# Patient Record
Sex: Male | Born: 1950 | Race: Black or African American | Hispanic: No | Marital: Married | Smoking: Never smoker
Health system: Southern US, Community
[De-identification: ages and names within clinical notes are randomized; demographics above are authoritative.]

## PROBLEM LIST (undated history)

## (undated) DIAGNOSIS — E7800 Pure hypercholesterolemia, unspecified: Secondary | ICD-10-CM

## (undated) DIAGNOSIS — E119 Type 2 diabetes mellitus without complications: Secondary | ICD-10-CM

## (undated) DIAGNOSIS — I10 Essential (primary) hypertension: Secondary | ICD-10-CM

## (undated) HISTORY — PX: BACK SURGERY: SHX140

---

## 1998-04-22 ENCOUNTER — Other Ambulatory Visit: Admission: RE | Admit: 1998-04-22 | Discharge: 1998-04-22 | Payer: Self-pay | Admitting: Podiatry

## 2000-07-15 ENCOUNTER — Emergency Department (HOSPITAL_COMMUNITY): Admission: EM | Admit: 2000-07-15 | Discharge: 2000-07-15 | Payer: Self-pay | Admitting: Emergency Medicine

## 2003-04-12 ENCOUNTER — Observation Stay (HOSPITAL_COMMUNITY): Admission: EM | Admit: 2003-04-12 | Discharge: 2003-04-13 | Payer: Self-pay | Admitting: Emergency Medicine

## 2009-09-07 ENCOUNTER — Encounter: Admission: RE | Admit: 2009-09-07 | Discharge: 2009-09-07 | Payer: Self-pay | Admitting: Internal Medicine

## 2009-09-15 ENCOUNTER — Ambulatory Visit (HOSPITAL_COMMUNITY): Admission: RE | Admit: 2009-09-15 | Discharge: 2009-09-16 | Payer: Self-pay | Admitting: Neurological Surgery

## 2009-10-25 ENCOUNTER — Encounter: Admission: RE | Admit: 2009-10-25 | Discharge: 2009-10-25 | Payer: Self-pay | Admitting: Neurological Surgery

## 2009-12-27 ENCOUNTER — Encounter: Admission: RE | Admit: 2009-12-27 | Discharge: 2009-12-27 | Payer: Self-pay | Admitting: Neurological Surgery

## 2010-05-22 LAB — BASIC METABOLIC PANEL
Calcium: 9.3 mg/dL (ref 8.4–10.5)
Creatinine, Ser: 0.94 mg/dL (ref 0.4–1.5)
GFR calc Af Amer: >60 mL/min (ref >60)
GFR calc non Af Amer: >60 mL/min (ref >60)
Sodium: 137 mEq/L (ref 135–145)

## 2010-05-22 LAB — CBC
Hemoglobin: 14 g/dL (ref 13.0–17.0)
Platelets: 177 10*3/uL (ref 150–400)
RBC: 4.71 MIL/uL (ref 4.22–5.81)
WBC: 4.6 10*3/uL (ref 4.0–10.5)

## 2010-05-22 LAB — DIFFERENTIAL
Eosinophils Absolute: 0.1 10*3/uL (ref 0.0–0.7)
Lymphocytes Relative: 37 % (ref 12–46)
Lymphs Abs: 1.7 10*3/uL (ref 0.7–4.0)
Neutro Abs: 2.3 10*3/uL (ref 1.7–7.7)
Neutrophils Relative %: 50 % (ref 43–77)

## 2010-05-22 LAB — GLUCOSE, CAPILLARY

## 2010-05-22 LAB — APTT: aPTT: 29 seconds (ref 24–37)

## 2010-05-22 LAB — PROTIME-INR
INR: 0.99 (ref 0.00–1.49)
Prothrombin Time: 13 seconds (ref 11.6–15.2)

## 2010-05-22 LAB — SURGICAL PCR SCREEN
MRSA, PCR: NEGATIVE
Staphylococcus aureus: NEGATIVE

## 2010-07-22 NOTE — Discharge Summary (Signed)
NAMEARON, INGE NO.:  [id number]   MEDICAL RECORD NO.:  [id number]                   PATIENT TYPE:  INP   LOCATION:  0376                                 FACILITY:  [facility]   PHYSICIAN:  Candyce Churn, M.D.          DATE OF BIRTH:  Jul 09, 1950   DATE OF ADMISSION:  04/12/2003  DATE OF DISCHARGE:  04/13/2003                                 DISCHARGE SUMMARY   DISCHARGE DIAGNOSES:  1. Chest pain, atypical.  2. History of hypertension.  3. History of hyperlipidemia.   MEDICATIONS:  1. Lisinopril 10 mg daily.  2. Lipitor 10 mg daily.  3. Aspirin 81 mg daily.  4. Protonix 40 mg daily.  5. New medication:  Sublingual nitroglycerin 0.4 mg one sublingual every     five minutes p.r.n. severe chest pain.   HOSPITAL COURSE:  Mr. Steven Kelly is a very pleasant 60 year old male  with a history of hypertension and hyperlipidemia who presented to the  emergency room on April 12, 2003, complaining of onset of left precordial  chest pain that seemed to radiate down his left arm and his arm muscle felt  somewhat weak.  He noticed some diaphoresis, but no shortness of breath.  He  had been sitting in church for an hour or so and had not eaten breakfast and  developed pain.  Apparently, started to develop pain around noon, and by  2:00 it was still somewhat problematic.  He was still having the pain and  came to the [facility] Emergency Room.  He was given four aspirin in the  [facility] Emergency Room, no nitroglycerin with eventual resolution of his  chest and arm pain.   EKG in the emergency room revealed a sinus rhythm at 60 with possible  minimal voltage criteria for LVH.  He had nonspecific ST-T wave changes.  No  ST elevation or depression.  He had serial cardiac enzymes drawn revealing  CPK's to be in the 200 range with CK-MB 4.7 and 4.3, respectively, and  troponin-I was 0.01 and less than 0.01.  He had no further chest pains or  diaphoresis while hospitalized.   The patient had been taking some Mobic for chest wall pain the week before.   He had exercised at the [facility] four to five days prior to admission, and had  exercised for 30 to 45 minutes aerobically and had not had any chest pain at  all.  He did use a machine that really exercised his upper chest.   On exam on the day of discharge, he has palpable chest wall pain over the  precordial area.  He has excellent strength in the left upper extremity with  no evidence of radiculopathy.   It was felt the patient probably was having some atypical chest pain,  perhaps musculoskeletal.  He will be seen by Dr. Verdis Prime likely later in  the week for his treadmill stress testing.  We will send him out on the  medications as above, and he is to contact us immediately if he develops any  shortness of breath or severe recurrent pain.  He will also be given  sublingual nitroglycerin to use if he develops pain that is not relieved at  rest.                                               Candyce Churn, M.D.    RNG/MEDQ  D:  04/13/2003  T:  04/13/2003  Job:  [id number]   cc:   Lesleigh Noe, M.D.  [address]  [address]  [place]  [place] [address]  Fax: [phone]

## 2013-11-14 ENCOUNTER — Encounter (HOSPITAL_BASED_OUTPATIENT_CLINIC_OR_DEPARTMENT_OTHER): Payer: Self-pay | Admitting: Emergency Medicine

## 2013-11-14 ENCOUNTER — Emergency Department (HOSPITAL_BASED_OUTPATIENT_CLINIC_OR_DEPARTMENT_OTHER)
Admission: EM | Admit: 2013-11-14 | Discharge: 2013-11-14 | Disposition: A | Discharge status: Home / Self Care | Payer: 59 | Attending: Emergency Medicine | Admitting: Emergency Medicine

## 2013-11-14 DIAGNOSIS — E7800 Pure hypercholesterolemia, unspecified: Secondary | ICD-10-CM | POA: Insufficient documentation

## 2013-11-14 DIAGNOSIS — Z79899 Other long term (current) drug therapy: Secondary | ICD-10-CM | POA: Insufficient documentation

## 2013-11-14 DIAGNOSIS — E119 Type 2 diabetes mellitus without complications: Secondary | ICD-10-CM | POA: Diagnosis not present

## 2013-11-14 DIAGNOSIS — R739 Hyperglycemia, unspecified: Secondary | ICD-10-CM

## 2013-11-14 DIAGNOSIS — I10 Essential (primary) hypertension: Secondary | ICD-10-CM | POA: Diagnosis not present

## 2013-11-14 HISTORY — DX: Type 2 diabetes mellitus without complications: E11.9

## 2013-11-14 HISTORY — DX: Pure hypercholesterolemia, unspecified: E78.00

## 2013-11-14 HISTORY — DX: Essential (primary) hypertension: I10

## 2013-11-14 LAB — BASIC METABOLIC PANEL
ANION GAP: 14 (ref 5–15)
BUN: 19 mg/dL (ref 6–23)
CALCIUM: 9.6 mg/dL (ref 8.4–10.5)
CHLORIDE: 99 meq/L (ref 96–112)
CO2: 24 meq/L (ref 19–32)
CREATININE: 1.1 mg/dL (ref 0.50–1.35)
GFR calc Af Amer: 80 mL/min — ABNORMAL LOW (ref >90)
GFR calc non Af Amer: 69 mL/min — ABNORMAL LOW (ref >90)
GLUCOSE: 299 mg/dL — AB (ref 70–99)
Potassium: 3.9 mEq/L (ref 3.7–5.3)
SODIUM: 137 meq/L (ref 137–147)

## 2013-11-14 LAB — CBG MONITORING, ED: Glucose-Capillary: 281 mg/dL — ABNORMAL HIGH (ref 70–99)

## 2013-11-14 NOTE — Discharge Instructions (Signed)

## 2013-11-14 NOTE — ED Provider Notes (Signed)
CSN: [id number]     Arrival date & time 11/14/13  2058 History   First MD Initiated Contact with Patient 11/14/13 2118     Chief Complaint  Patient presents with  . Hyperglycemia     (Consider location/radiation/quality/duration/timing/severity/associated sxs/prior Treatment) Patient is a 63 y.o. male presenting with hyperglycemia. The history is provided by the patient and the spouse.  Hyperglycemia Blood sugar level PTA:  320 Severity:  Moderate Onset quality:  Gradual Duration:  1 week Timing:  Constant Progression:  Worsening Chronicity:  New Diabetes status:  Controlled with oral medications Current diabetic therapy:  Metformin Context: recent change in diet   Context: not change in medication   Relieved by:  Nothing Associated symptoms: no abdominal pain and no dysuria   Risk factors: family hx of diabetes     Past Medical History  Diagnosis Date  . Diabetes mellitus without complication   . Hypertension   . Hypercholesteremia    Past Surgical History  Procedure Laterality Date  . Back surgery     History reviewed. No pertinent family history. History  Substance Use Topics  . Smoking status: Never Smoker   . Smokeless tobacco: Not on file  . Alcohol Use: No    Review of Systems  Gastrointestinal: Negative for abdominal pain.  Genitourinary: Negative for dysuria.  All other systems reviewed and are negative.     Allergies  Review of patient's allergies indicates not on file.  Home Medications   Prior to Admission medications   Medication Sig Start Date End Date Taking? Authorizing Provider  lisinopril-hydrochlorothiazide (PRINZIDE,ZESTORETIC) 20-12.5 MG per tablet Take 1 tablet by mouth daily.   Yes Historical Provider, MD  metFORMIN (GLUCOPHAGE) 1000 MG tablet Take 1,000 mg by mouth 2 (two) times daily with a meal.   Yes Historical Provider, MD  rosuvastatin (CRESTOR) 10 MG tablet Take 10 mg by mouth daily.   Yes Historical Provider, MD   BP  142/86  Pulse 75  Temp(Src) 98.7 F (37.1 C)  Resp 18  Ht 6' (1.829 m)  Wt 213 lb (96.616 kg)  BMI 28.88 kg/m2  SpO2 100% Physical Exam  Nursing note and vitals reviewed. Constitutional: He is oriented to person, place, and time. He appears well-developed and well-nourished.  HENT:  Head: Normocephalic and atraumatic.  Eyes: EOM are normal. Pupils are equal, round, and reactive to light.  Neck: Normal range of motion.  Cardiovascular: Normal rate and normal heart sounds.   Pulmonary/Chest: Effort normal.  Abdominal: Soft. He exhibits no distension.  Musculoskeletal: Normal range of motion.  Neurological: He is alert and oriented to person, place, and time.  Skin: Skin is warm.  Psychiatric: He has a normal mood and affect.    ED Course  Procedures (including critical care time) Labs Review Labs Reviewed  CBG MONITORING, ED - Abnormal; Notable for the following:    Glucose-Capillary 281 (*)    All other components within normal limits    Imaging Review No results found.   EKG Interpretation None      MDM  Pt recently retired.  He has been working outdoors and has been drinking a lot of gatoraid.   Final diagnoses:  Hyperglycemia    Pt advised to watch diet closely,   Stop gatoraids.  Pt advised to see his Md for recheck    Elson Areas, [place] 11/14/13 2203

## 2013-11-14 NOTE — ED Notes (Signed)
Blood glucose 281 in triage

## 2013-11-14 NOTE — ED Provider Notes (Signed)
Medical screening examination/treatment/procedure(s) were performed by non-physician practitioner and as supervising physician I was immediately available for consultation/collaboration.    Linwood Dibbles, MD 11/14/13 [phone]

## 2013-11-14 NOTE — ED Notes (Signed)
States blood sugar was 306 this pl  Has been running high for a week   Has been drinking gator ade up till a week ago due to working outside [employer]

## 2013-11-14 NOTE — ED Notes (Signed)
Pt reports blood sugars have been elevated all week in high 200's, tonight blood sugar went up to 320, pt decided he would just wait and see pcp on Monday, but family insisted that he comes

## 2013-11-27 ENCOUNTER — Ambulatory Visit: Payer: 59

## 2013-12-04 ENCOUNTER — Ambulatory Visit: Payer: 59

## 2013-12-11 ENCOUNTER — Ambulatory Visit: Payer: 59

## 2014-01-05 ENCOUNTER — Ambulatory Visit: Payer: 59 | Admitting: *Deleted

## 2014-01-12 ENCOUNTER — Ambulatory Visit: Payer: 59 | Admitting: *Deleted

## 2017-08-29 ENCOUNTER — Ambulatory Visit
Admission: RE | Admit: 2017-08-29 | Discharge: 2017-08-29 | Disposition: A | Discharge status: Home / Self Care | Payer: Self-pay | Source: Ambulatory Visit | Attending: Internal Medicine | Admitting: Internal Medicine

## 2017-08-29 ENCOUNTER — Other Ambulatory Visit: Payer: Self-pay | Admitting: Internal Medicine

## 2017-08-29 DIAGNOSIS — M79645 Pain in left finger(s): Secondary | ICD-10-CM

## 2018-01-22 ENCOUNTER — Other Ambulatory Visit: Payer: Self-pay | Admitting: Internal Medicine

## 2018-01-22 ENCOUNTER — Ambulatory Visit
Admission: RE | Admit: 2018-01-22 | Discharge: 2018-01-22 | Disposition: A | Discharge status: Home / Self Care | Payer: Medicare Other | Source: Ambulatory Visit | Attending: Internal Medicine | Admitting: Internal Medicine

## 2018-01-22 DIAGNOSIS — M79674 Pain in right toe(s): Secondary | ICD-10-CM

## 2018-01-25 ENCOUNTER — Other Ambulatory Visit: Payer: Self-pay | Admitting: Podiatry

## 2018-01-25 ENCOUNTER — Ambulatory Visit: Payer: Medicare Other

## 2018-01-25 ENCOUNTER — Ambulatory Visit (INDEPENDENT_AMBULATORY_CARE_PROVIDER_SITE_OTHER): Payer: Medicare Other

## 2018-01-25 ENCOUNTER — Ambulatory Visit (INDEPENDENT_AMBULATORY_CARE_PROVIDER_SITE_OTHER): Payer: Medicare Other | Admitting: Podiatry

## 2018-01-25 ENCOUNTER — Encounter: Payer: Self-pay | Admitting: Podiatry

## 2018-01-25 VITALS — BP 136/95 | HR 76

## 2018-01-25 DIAGNOSIS — M779 Enthesopathy, unspecified: Secondary | ICD-10-CM

## 2018-01-25 DIAGNOSIS — M79672 Pain in left foot: Secondary | ICD-10-CM

## 2018-01-25 DIAGNOSIS — M79671 Pain in right foot: Secondary | ICD-10-CM | POA: Diagnosis not present

## 2018-01-25 DIAGNOSIS — M205X9 Other deformities of toe(s) (acquired), unspecified foot: Secondary | ICD-10-CM

## 2018-01-25 DIAGNOSIS — M205X1 Other deformities of toe(s) (acquired), right foot: Secondary | ICD-10-CM

## 2018-01-25 DIAGNOSIS — M205X2 Other deformities of toe(s) (acquired), left foot: Secondary | ICD-10-CM

## 2018-01-25 MED ORDER — TRIAMCINOLONE ACETONIDE 10 MG/ML IJ SUSP
10.0000 mg | Freq: Once | INTRAMUSCULAR | Status: AC
  Administered 2018-01-25: 10 mg

## 2018-01-25 MED ORDER — DICLOFENAC SODIUM 75 MG PO TBEC: 75.0000 mg | DELAYED_RELEASE_TABLET | Freq: Two times a day (BID) | ORAL | 2 refills | Status: AC

## 2018-01-27 NOTE — Progress Notes (Signed)
Subjective:   Patient ID: Steven Kelly, male   DOB: 67 y.o.   MRN: [id number]   HPI Patient states he was doing well but he started developed pain around his big toe joint in the last few weeks and he did have bunion surgery 15 years ago.  Patient does not smoke likes to be active   Review of Systems  All other systems reviewed and are negative.       Objective:  Physical Exam  Constitutional: He appears well-developed and well-nourished.  Cardiovascular: Intact distal pulses.  Pulmonary/Chest: Effort normal.  Musculoskeletal: Normal range of motion.  Neurological: He is alert.  Skin: Skin is warm.  Nursing note and vitals reviewed.   Neurovascular status intact muscle strength is adequate range of motion within normal limits with patient found to have inflammation around the first MPJ right with moderate narrowing of the joint surface.  There is mild crepitus within the joint and no indications of other pathology or any acute traumatic process.  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

## 2018-12-06 ENCOUNTER — Other Ambulatory Visit: Payer: Self-pay

## 2018-12-06 DIAGNOSIS — Z20822 Contact with and (suspected) exposure to covid-19: Secondary | ICD-10-CM

## 2018-12-07 LAB — NOVEL CORONAVIRUS, NAA: SARS-CoV-2, NAA: NOT DETECTED

## 2019-04-17 ENCOUNTER — Ambulatory Visit: Payer: Medicare Other

## 2019-04-27 IMAGING — CR DG TOE GREAT 2+V*R*
3 series · 3 of 3 positions shown · non-contrast
Comparison: None.

CLINICAL DATA: History of bunion surgery 10-15 years ago, pain in
the right great toe, no trauma

EXAM:
RIGHT GREAT TOE

[t toes ap right]
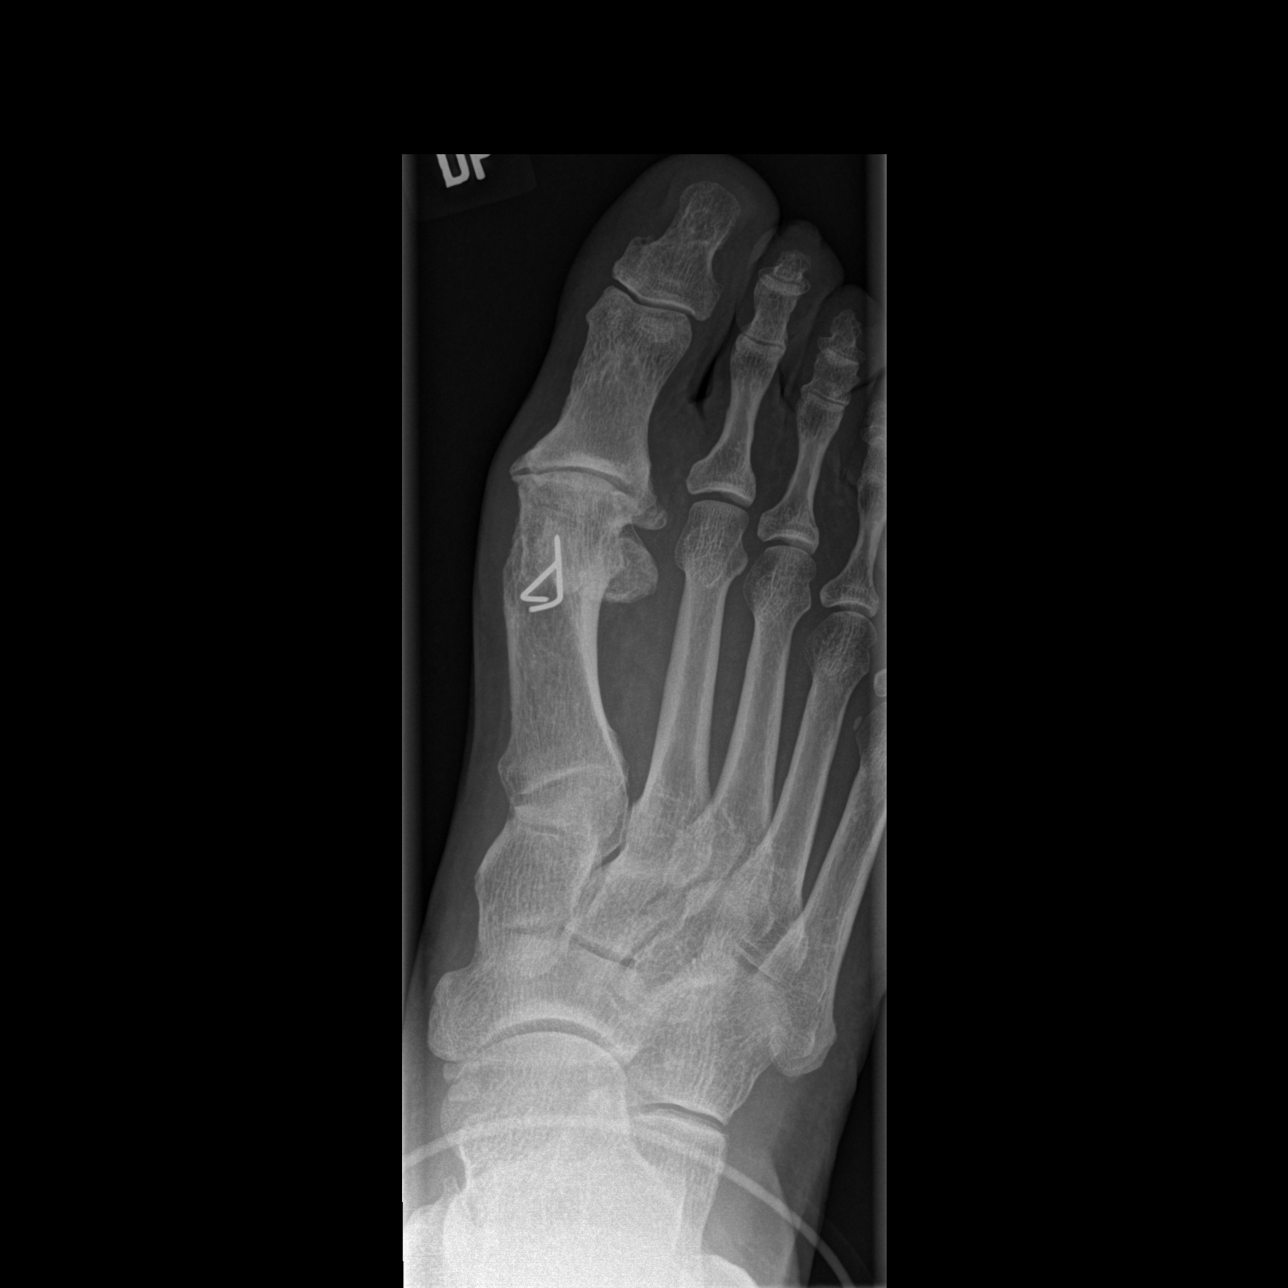

[t toes oblique right]
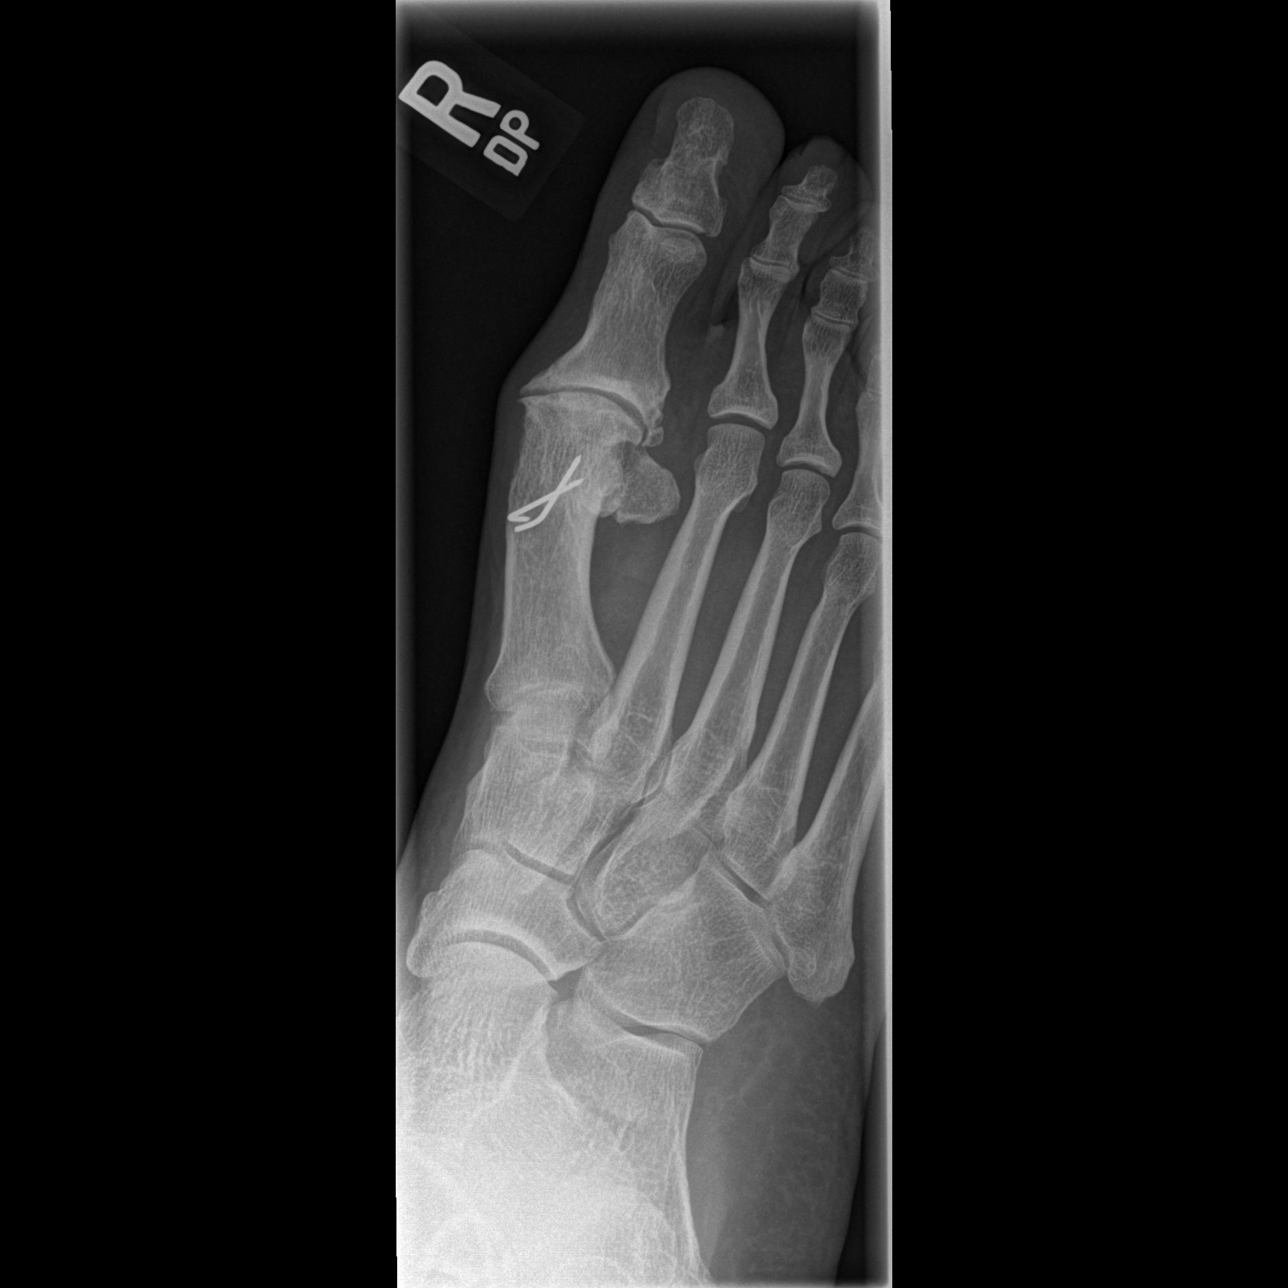

[t toes lateral right]
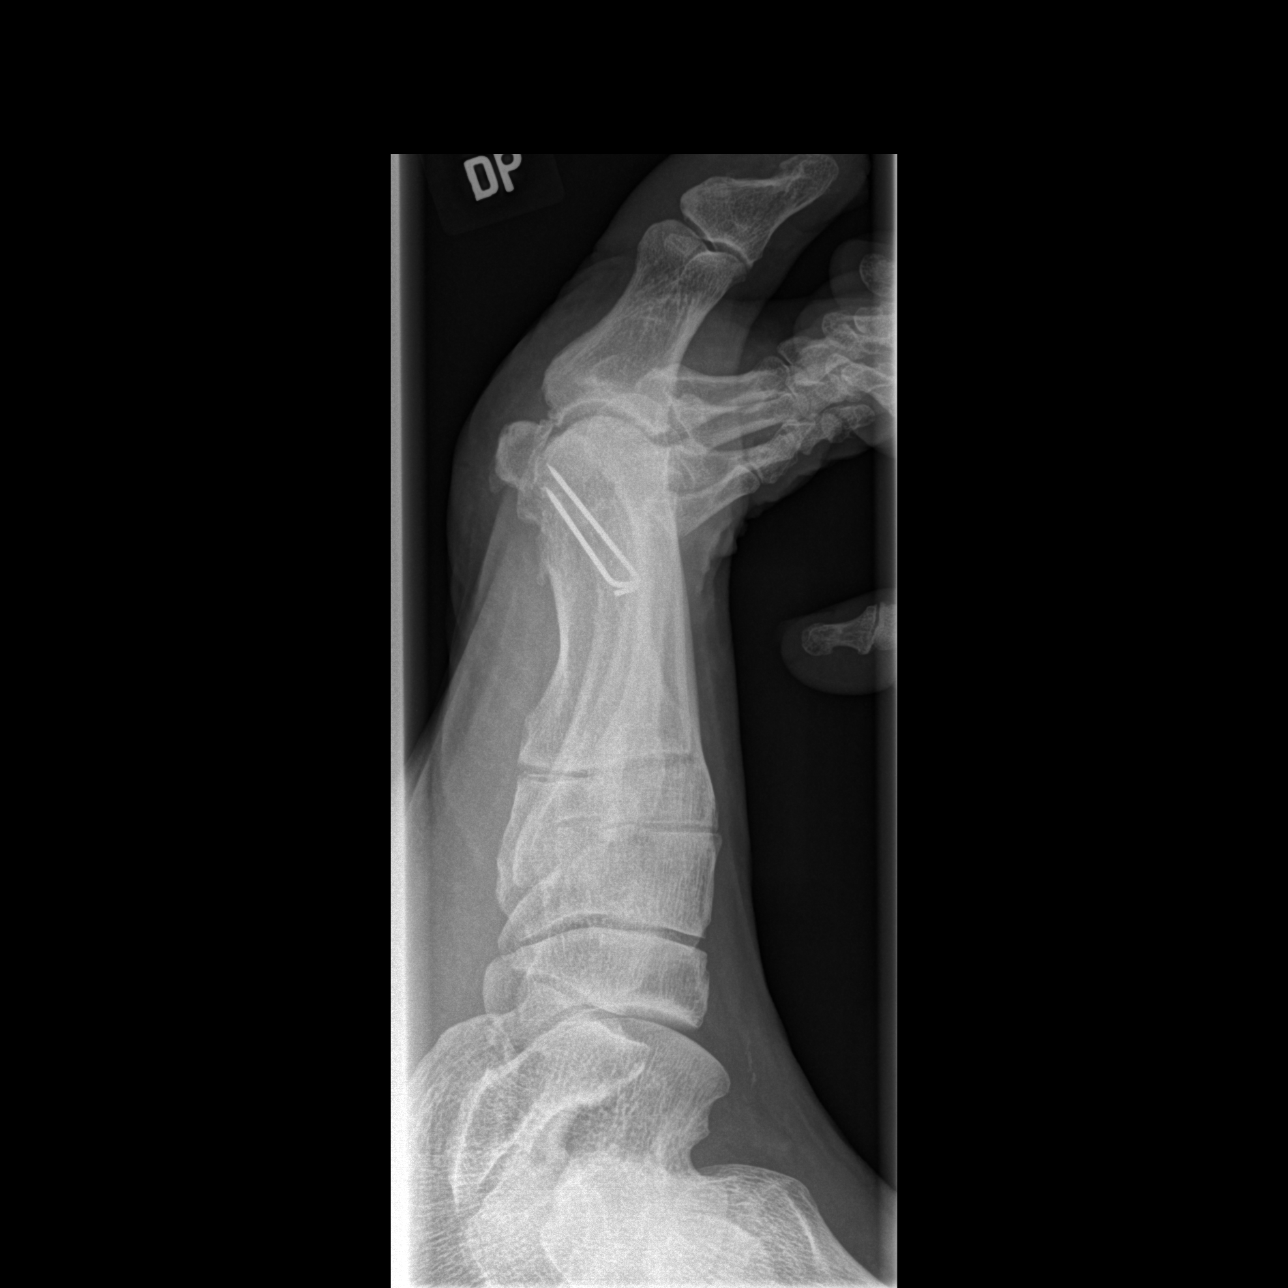

[3 of 3 positions shown; findings below may reference images not displayed]

FINDINGS: Two K-wire pins are noted within the distal right first metatarsal
after prior osteotomy. There is significant degenerative joint
disease involving the right first MTP joint with loss of joint
space, sclerosis, and spurring present. No acute fracture is seen.
No erosion or demineralization is noted.
IMPRESSION: Significant degenerative joint disease involves the right first MTP
joint. Prior changes of right first metatarsal osteotomy.

## 2019-08-22 ENCOUNTER — Other Ambulatory Visit: Payer: Self-pay

## 2019-08-22 ENCOUNTER — Other Ambulatory Visit: Payer: Self-pay | Admitting: Podiatry

## 2019-08-22 ENCOUNTER — Ambulatory Visit: Payer: Medicare Other | Admitting: Podiatry

## 2019-08-22 ENCOUNTER — Encounter: Payer: Self-pay | Admitting: Podiatry

## 2019-08-22 ENCOUNTER — Ambulatory Visit (INDEPENDENT_AMBULATORY_CARE_PROVIDER_SITE_OTHER): Payer: Medicare Other

## 2019-08-22 VITALS — Temp 97.3°F

## 2019-08-22 DIAGNOSIS — M79671 Pain in right foot: Secondary | ICD-10-CM

## 2019-08-22 DIAGNOSIS — M79672 Pain in left foot: Secondary | ICD-10-CM

## 2019-08-22 DIAGNOSIS — M779 Enthesopathy, unspecified: Secondary | ICD-10-CM

## 2019-08-22 DIAGNOSIS — M21619 Bunion of unspecified foot: Secondary | ICD-10-CM | POA: Diagnosis not present

## 2019-08-24 NOTE — Progress Notes (Signed)
Subjective:   Patient ID: Steven Kelly, male   DOB: 69 y.o.   MRN: [id number]   HPI Patient states that he gets foot pain at periodic times and also he has orthotics which are very old and no longer providing support and he needs a new pair.  Patient states for the most part they are able to keep his pain under control but recently not as effective   ROS      Objective:  Physical Exam  Neurovascular status intact with patient found to have significant depression of the arch bilateral moderate bunion deformity and discomfort which is through the arch and mildly into the heel bilateral.  Patient has no other pathology and had good digital perfusion     Assessment:  Tendinitis-like symptomatology along with flatfoot deformity which is causing stress on his feet legs and back with orthotics which are no longer providing support     Plan:  H&P reviewed condition and recommended new orthotics.  At this point he is casted for functional orthotics and we explained usage of them and I also went over with him shoe gear modification stretching exercises topical medicines for pain and occasional oral medications.  Patient will be seen back when orthotics are ready and was educated today  X-rays indicate there is depression of the arch mild arthritis and spurring no indications of advanced deformity

## 2019-09-15 ENCOUNTER — Ambulatory Visit: Payer: Medicare Other | Admitting: Orthotics

## 2019-09-15 ENCOUNTER — Other Ambulatory Visit: Payer: Self-pay

## 2019-09-15 DIAGNOSIS — M21619 Bunion of unspecified foot: Secondary | ICD-10-CM

## 2019-09-15 DIAGNOSIS — M79672 Pain in left foot: Secondary | ICD-10-CM

## 2019-09-15 NOTE — Progress Notes (Signed)
Patient came in today to pick up custom made foot orthotics.  The goals were accomplished and the patient reported no dissatisfaction with said orthotics.  Patient was advised of breakin period and how to report any issues. 

## 2020-01-05 ENCOUNTER — Ambulatory Visit: Payer: Medicare Other | Attending: Internal Medicine

## 2020-01-05 DIAGNOSIS — Z23 Encounter for immunization: Secondary | ICD-10-CM

## 2020-01-05 NOTE — Progress Notes (Signed)
   Covid-19 Vaccination Clinic  Name:  Abdurrahman Petersheim    MRN: [id number] DOB: [birth year]  01/05/2020  Mr. Bauserman was observed post Covid-19 immunization for 15 minutes without incident. He was provided with Vaccine Information Sheet and instruction to access the V-Safe system.   Mr. Bacallao was instructed to call 911 with any severe reactions post vaccine: Marland Kitchen Difficulty breathing  . Swelling of face and throat  . A fast heartbeat  . A bad rash all over body  . Dizziness and weakness

## 2020-03-27 DIAGNOSIS — K219 Gastro-esophageal reflux disease without esophagitis: Secondary | ICD-10-CM | POA: Diagnosis not present

## 2020-03-27 DIAGNOSIS — I10 Essential (primary) hypertension: Secondary | ICD-10-CM | POA: Diagnosis not present

## 2020-03-27 DIAGNOSIS — E119 Type 2 diabetes mellitus without complications: Secondary | ICD-10-CM | POA: Diagnosis not present

## 2020-03-27 DIAGNOSIS — E1165 Type 2 diabetes mellitus with hyperglycemia: Secondary | ICD-10-CM | POA: Diagnosis not present

## 2020-03-27 DIAGNOSIS — M179 Osteoarthritis of knee, unspecified: Secondary | ICD-10-CM | POA: Diagnosis not present

## 2020-03-27 DIAGNOSIS — E782 Mixed hyperlipidemia: Secondary | ICD-10-CM | POA: Diagnosis not present

## 2020-05-31 DIAGNOSIS — E119 Type 2 diabetes mellitus without complications: Secondary | ICD-10-CM | POA: Diagnosis not present

## 2020-05-31 DIAGNOSIS — Z7984 Long term (current) use of oral hypoglycemic drugs: Secondary | ICD-10-CM | POA: Diagnosis not present

## 2020-05-31 DIAGNOSIS — I10 Essential (primary) hypertension: Secondary | ICD-10-CM | POA: Diagnosis not present

## 2020-05-31 DIAGNOSIS — Z23 Encounter for immunization: Secondary | ICD-10-CM | POA: Diagnosis not present

## 2020-05-31 DIAGNOSIS — M179 Osteoarthritis of knee, unspecified: Secondary | ICD-10-CM | POA: Diagnosis not present

## 2020-05-31 DIAGNOSIS — K219 Gastro-esophageal reflux disease without esophagitis: Secondary | ICD-10-CM | POA: Diagnosis not present

## 2020-05-31 DIAGNOSIS — E1165 Type 2 diabetes mellitus with hyperglycemia: Secondary | ICD-10-CM | POA: Diagnosis not present

## 2020-05-31 DIAGNOSIS — E782 Mixed hyperlipidemia: Secondary | ICD-10-CM | POA: Diagnosis not present

## 2020-06-03 DIAGNOSIS — E1165 Type 2 diabetes mellitus with hyperglycemia: Secondary | ICD-10-CM | POA: Diagnosis not present

## 2020-06-28 DIAGNOSIS — M779 Enthesopathy, unspecified: Secondary | ICD-10-CM

## 2020-07-02 DIAGNOSIS — E119 Type 2 diabetes mellitus without complications: Secondary | ICD-10-CM | POA: Diagnosis not present

## 2020-07-02 DIAGNOSIS — I10 Essential (primary) hypertension: Secondary | ICD-10-CM | POA: Diagnosis not present

## 2020-07-02 DIAGNOSIS — E782 Mixed hyperlipidemia: Secondary | ICD-10-CM | POA: Diagnosis not present

## 2020-07-02 DIAGNOSIS — E1165 Type 2 diabetes mellitus with hyperglycemia: Secondary | ICD-10-CM | POA: Diagnosis not present

## 2020-07-02 DIAGNOSIS — K219 Gastro-esophageal reflux disease without esophagitis: Secondary | ICD-10-CM | POA: Diagnosis not present

## 2020-07-02 DIAGNOSIS — M179 Osteoarthritis of knee, unspecified: Secondary | ICD-10-CM | POA: Diagnosis not present

## 2020-07-08 ENCOUNTER — Telehealth: Payer: Self-pay | Admitting: Podiatry

## 2020-07-08 NOTE — Telephone Encounter (Signed)
Refurbished orthotics in.. lvm for pt ok to pick up when he is available.Marland KitchenMarland Kitchen

## 2020-07-13 DIAGNOSIS — I10 Essential (primary) hypertension: Secondary | ICD-10-CM | POA: Diagnosis not present

## 2020-07-13 DIAGNOSIS — M179 Osteoarthritis of knee, unspecified: Secondary | ICD-10-CM | POA: Diagnosis not present

## 2020-07-13 DIAGNOSIS — E1165 Type 2 diabetes mellitus with hyperglycemia: Secondary | ICD-10-CM | POA: Diagnosis not present

## 2020-07-13 DIAGNOSIS — E119 Type 2 diabetes mellitus without complications: Secondary | ICD-10-CM | POA: Diagnosis not present

## 2020-07-13 DIAGNOSIS — E782 Mixed hyperlipidemia: Secondary | ICD-10-CM | POA: Diagnosis not present

## 2020-07-13 DIAGNOSIS — K219 Gastro-esophageal reflux disease without esophagitis: Secondary | ICD-10-CM | POA: Diagnosis not present

## 2020-07-20 ENCOUNTER — Encounter: Payer: Self-pay | Admitting: Dietician

## 2020-07-20 ENCOUNTER — Other Ambulatory Visit: Payer: Self-pay

## 2020-07-20 ENCOUNTER — Encounter: Payer: Medicare Other | Attending: Internal Medicine | Admitting: Dietician

## 2020-07-20 VITALS — Ht 72.0 in | Wt 203.6 lb

## 2020-07-20 DIAGNOSIS — E119 Type 2 diabetes mellitus without complications: Secondary | ICD-10-CM | POA: Diagnosis not present

## 2020-07-20 NOTE — Progress Notes (Signed)
Diabetes Self-Management Education  Visit Type: First/Initial  Appt. Start Time: 1045 Appt. End Time: 1150  07/20/2020  Mr. Steven Kelly, identified by name and date of birth, is a 70 y.o. male with a diagnosis of Diabetes: Type 2.   ASSESSMENT  Height 6' (1.829 m), weight 203 lb 9.6 oz (92.4 kg). Body mass index is 27.61 kg/m.   Pt is currently taking glimiperide and metformin, glimiperide was increased to BID upon A1c rising to 8.7. Pt reports losing a lot of weight and does not want to lose anymore. Pt was ~213 lbs. 3 months ago, and is 203 lbs. today. Pt reports leaving sugar alone previously and their BG levels improved. Pt has stopped eating bananas and raisins. Now snacks on strawberries and unflavored nuts. Pt has a 3 month follow up with PCP next month to recheck A1c. Pt reports checking BG fasting and in the afternoon. May see 68-90 FBG, and has seen numbers as low as the mid 50's in the afternoon. Pt reports seeing lows after exercising sometimes, but always checks BG before exercising. Pt states they have BG below 70 multiple times a week. Pt knows to eat/drink sugar if their BG is below 70, usually has a glass of orange juice. [facility]. Pt does cardio activities there for ~90 minutes. Pt reports they have been exercising for over 25 years. Pt is also physically active doing yard work.   Diabetes Self-Management Education - 07/20/20 1101      Visit Information   Visit Type First/Initial      Initial Visit   Diabetes Type Type 2    Are you currently following a meal plan? No    Are you taking your medications as prescribed? Yes    Date Diagnosed ~2005      Health Coping   How would you rate your overall health? Good      Psychosocial Assessment   Patient Belief/Attitude about Diabetes Other (comment)   "OK"   Self-care barriers None    Self-management support Doctor's office    Other persons present  Patient    Patient Concerns Nutrition/Meal planning    Special Needs None    Preferred Learning Style No preference indicated    Learning Readiness Ready    How often do you need to have someone help you when you read instructions, pamphlets, or other written materials from your doctor or pharmacy? 1 - Never    What is the last grade level you completed in school? 12th grade      Pre-Education Assessment   Patient understands the diabetes disease and treatment process. Needs Instruction    Patient understands incorporating nutritional management into lifestyle. Needs Instruction    Patient undertands incorporating physical activity into lifestyle. Needs Instruction    Patient understands using medications safely. Needs Instruction    Patient understands monitoring blood glucose, interpreting and using results Needs Instruction    Patient understands prevention, detection, and treatment of acute complications. Needs Instruction    Patient understands prevention, detection, and treatment of chronic complications. Needs Instruction    Patient understands how to develop strategies to address psychosocial issues. Needs Instruction    Patient understands how to develop strategies to promote health/change behavior. Needs Instruction      Complications   Last HgB A1C per patient/outside source 8.7 %   05/24/2020   How often do you check your blood sugar? 1-2 times/day  Fasting Blood glucose range (mg/dL) 63-149    Postprandial Blood glucose range (mg/dL) 70-263    Number of hypoglycemic episodes per month 8    Can you tell when your blood sugar is low? Yes    What do you do if your blood sugar is low? Eat and wait until it gets back up    Number of hyperglycemic episodes per week 0    Have you had a dilated eye exam in the past 12 months? Yes    Have you had a dental exam in the past 12 months? Yes    Are you checking your feet? Yes    How many days per week are you checking your feet? 7       Dietary Intake   Breakfast oatmeal, with splenda    Snack (morning) bacon, bologna, eggs, 2 pieces of toast, hot cinnamon tea    Lunch [employer] roast beef, cabbage, collard greens, water    Snack (afternoon) popcorn    Dinner Chicken with tomatoes and cucmbers and [place] dressing, baked beans    Snack (evening) 4-5 strawberries    Beverage(s) water, cinnamon tea      Exercise   Exercise Type ADL's;Moderate (swimming / aerobic walking)    How many days per week to you exercise? 3    How many minutes per day do you exercise? 90    Total minutes per week of exercise 270      Patient Education   Previous Diabetes Education No    Disease state  Factors that contribute to the development of diabetes;Explored patient's options for treatment of their diabetes    Nutrition management  Role of diet in the treatment of diabetes and the relationship between the three main macronutrients and blood glucose level;Food label reading, portion sizes and measuring food.;Carbohydrate counting    Physical activity and exercise  Role of exercise on diabetes management, blood pressure control and cardiac health.;Identified with patient nutritional and/or medication changes necessary with exercise.    Medications Reviewed patients medication for diabetes, action, purpose, timing of dose and side effects.    Monitoring Purpose and frequency of SMBG.;Taught/discussed recording of test results and interpretation of SMBG.;Identified appropriate SMBG and/or A1C goals.    Acute complications Taught treatment of hypoglycemia - the 15 rule.    Psychosocial adjustment Worked with patient to identify barriers to care and solutions;Role of stress on diabetes    Personal strategies to promote health Lifestyle issues that need to be addressed for better diabetes care      Individualized Goals (developed by patient)   Nutrition Follow meal plan discussed    Physical Activity Exercise 3-5 times per week    Medications take my  medication as prescribed    Monitoring  test my blood glucose as discussed    Reducing Risk treat hypoglycemia with 15 grams of carbs if blood glucose less than 70mg /dL;examine blood glucose patterns      Post-Education Assessment   Patient understands the diabetes disease and treatment process. Needs Review    Patient understands incorporating nutritional management into lifestyle. Needs Review    Patient undertands incorporating physical activity into lifestyle. Needs Review    Patient understands using medications safely. Needs Review    Patient understands monitoring blood glucose, interpreting and using results Needs Review    Patient understands prevention, detection, and treatment of acute complications. Needs Review    Patient understands prevention, detection, and treatment of chronic complications. Needs Review  Patient understands how to develop strategies to address psychosocial issues. Needs Review    Patient understands how to develop strategies to promote health/change behavior. Needs Review      Outcomes   Expected Outcomes Demonstrated interest in learning. Expect positive outcomes    Future DMSE PRN    Program Status Not Completed           Individualized Plan for Diabetes Self-Management Training:   Learning Objective:  Patient will have a greater understanding of diabetes self-management. Patient education plan is to attend individual and/or group sessions per assessed needs and concerns.   Plan:   Patient Instructions  Check your blood sugar every morning before you eat, and two hours after you eat a meal.  Look for blood sugar numbers between 70-100 when fasting, and no more than 140 after eating.  Always check your blood sugar before you exercise! If it is 100 or lower, have a snack of 15g of carbs before exercising.  If your blood sugar is below 70, have 15g of fast digesting carbs (4 oz. of orange juice, 3-5 hard candies) to get your sugar back  up.  Choose low fat and low salt popcorn for a snack.  Work towards eating three meals a day, about 5-6 hours apart!  Begin to recognize carbohydrates in your food choices!  Have 3 carb choices at each meal (45 g), and add 15g to the breakfast before you exercise.  Begin to build your meals using the proportions of the Balanced Plate. . First, select your carb choice(s) for the meal, and determine how much you should have to equal 3 carb choices (45 g). . Next, select your source of protein to pair with your carb choice(s). . Finally, complete the remaining half of your meal with a variety of non-starchy vegetables.    Expected Outcomes:  Demonstrated interest in learning. Expect positive outcomes  Education material provided: Food label handouts, Meal plan card, My Plate and Snack sheet  If problems or questions, patient to contact team via:  Email  Future DSME appointment: PRN

## 2020-07-20 NOTE — Patient Instructions (Addendum)
Check your blood sugar every morning before you eat, and two hours after you eat a meal.  Look for blood sugar numbers between 70-100 when fasting, and no more than 140 after eating.  Always check your blood sugar before you exercise! If it is 100 or lower, have a snack of 15g of carbs before exercising.  If your blood sugar is below 70, have 15g of fast digesting carbs (4 oz. of orange juice, 3-5 hard candies) to get your sugar back up.  Choose low fat and low salt popcorn for a snack.  Work towards eating three meals a day, about 5-6 hours apart!  Begin to recognize carbohydrates in your food choices!  Have 3 carb choices at each meal (45 g), and add 15g to the breakfast before you exercise.  Begin to build your meals using the proportions of the Balanced Plate. . First, select your carb choice(s) for the meal, and determine how much you should have to equal 3 carb choices (45 g). . Next, select your source of protein to pair with your carb choice(s). . Finally, complete the remaining half of your meal with a variety of non-starchy vegetables.

## 2020-09-01 DIAGNOSIS — E782 Mixed hyperlipidemia: Secondary | ICD-10-CM | POA: Diagnosis not present

## 2020-09-01 DIAGNOSIS — E1165 Type 2 diabetes mellitus with hyperglycemia: Secondary | ICD-10-CM | POA: Diagnosis not present

## 2020-09-01 DIAGNOSIS — I10 Essential (primary) hypertension: Secondary | ICD-10-CM | POA: Diagnosis not present

## 2020-09-27 DIAGNOSIS — L723 Sebaceous cyst: Secondary | ICD-10-CM | POA: Diagnosis not present

## 2020-09-27 DIAGNOSIS — L089 Local infection of the skin and subcutaneous tissue, unspecified: Secondary | ICD-10-CM | POA: Diagnosis not present

## 2020-10-26 DIAGNOSIS — H2512 Age-related nuclear cataract, left eye: Secondary | ICD-10-CM | POA: Diagnosis not present

## 2020-10-26 DIAGNOSIS — H52203 Unspecified astigmatism, bilateral: Secondary | ICD-10-CM | POA: Diagnosis not present

## 2020-10-26 DIAGNOSIS — E119 Type 2 diabetes mellitus without complications: Secondary | ICD-10-CM | POA: Diagnosis not present

## 2020-10-28 DIAGNOSIS — R350 Frequency of micturition: Secondary | ICD-10-CM | POA: Diagnosis not present

## 2020-12-07 DIAGNOSIS — Z7984 Long term (current) use of oral hypoglycemic drugs: Secondary | ICD-10-CM | POA: Diagnosis not present

## 2020-12-07 DIAGNOSIS — I10 Essential (primary) hypertension: Secondary | ICD-10-CM | POA: Diagnosis not present

## 2020-12-07 DIAGNOSIS — Z0000 Encounter for general adult medical examination without abnormal findings: Secondary | ICD-10-CM | POA: Diagnosis not present

## 2020-12-07 DIAGNOSIS — E559 Vitamin D deficiency, unspecified: Secondary | ICD-10-CM | POA: Diagnosis not present

## 2020-12-07 DIAGNOSIS — E1165 Type 2 diabetes mellitus with hyperglycemia: Secondary | ICD-10-CM | POA: Diagnosis not present

## 2020-12-07 DIAGNOSIS — M179 Osteoarthritis of knee, unspecified: Secondary | ICD-10-CM | POA: Diagnosis not present

## 2020-12-07 DIAGNOSIS — N451 Epididymitis: Secondary | ICD-10-CM | POA: Diagnosis not present

## 2020-12-07 DIAGNOSIS — E782 Mixed hyperlipidemia: Secondary | ICD-10-CM | POA: Diagnosis not present

## 2020-12-07 DIAGNOSIS — Z1389 Encounter for screening for other disorder: Secondary | ICD-10-CM | POA: Diagnosis not present

## 2020-12-07 DIAGNOSIS — K219 Gastro-esophageal reflux disease without esophagitis: Secondary | ICD-10-CM | POA: Diagnosis not present

## 2020-12-07 DIAGNOSIS — Z23 Encounter for immunization: Secondary | ICD-10-CM | POA: Diagnosis not present

## 2020-12-21 DIAGNOSIS — N451 Epididymitis: Secondary | ICD-10-CM | POA: Diagnosis not present

## 2021-03-09 ENCOUNTER — Other Ambulatory Visit: Payer: Medicare Other

## 2021-03-10 ENCOUNTER — Other Ambulatory Visit: Payer: Medicare Other

## 2021-03-14 ENCOUNTER — Other Ambulatory Visit: Payer: Self-pay

## 2021-03-14 ENCOUNTER — Ambulatory Visit: Payer: Medicare Other

## 2021-03-14 DIAGNOSIS — M779 Enthesopathy, unspecified: Secondary | ICD-10-CM

## 2021-03-14 NOTE — Progress Notes (Signed)
SITUATION Reason for Consult: Follow-up with bilateral foot orthotics Patient / Caregiver Report: Patient reports inserts are coming unglued  OBJECTIVE DATA History / Diagnosis:    ICD-10-CM   1. Tendonitis  M77.9       Change in Pathology: None  ACTIONS PERFORMED Patient's equipment was checked for structural stability and fit. Replaced vinyl top coat with leather. Device(s) intact and fit is excellent. All questions answered and concerns addressed.  PLAN Follow-up as needed (PRN). Plan of care discussed with and agreed upon by patient / caregiver.

## 2021-06-07 DIAGNOSIS — K219 Gastro-esophageal reflux disease without esophagitis: Secondary | ICD-10-CM | POA: Diagnosis not present

## 2021-06-07 DIAGNOSIS — E782 Mixed hyperlipidemia: Secondary | ICD-10-CM | POA: Diagnosis not present

## 2021-06-07 DIAGNOSIS — M179 Osteoarthritis of knee, unspecified: Secondary | ICD-10-CM | POA: Diagnosis not present

## 2021-06-07 DIAGNOSIS — E1165 Type 2 diabetes mellitus with hyperglycemia: Secondary | ICD-10-CM | POA: Diagnosis not present

## 2021-06-07 DIAGNOSIS — E559 Vitamin D deficiency, unspecified: Secondary | ICD-10-CM | POA: Diagnosis not present

## 2021-10-27 DIAGNOSIS — R350 Frequency of micturition: Secondary | ICD-10-CM | POA: Diagnosis not present

## 2021-11-01 DIAGNOSIS — H2513 Age-related nuclear cataract, bilateral: Secondary | ICD-10-CM | POA: Diagnosis not present

## 2021-11-01 DIAGNOSIS — E119 Type 2 diabetes mellitus without complications: Secondary | ICD-10-CM | POA: Diagnosis not present

## 2021-11-01 DIAGNOSIS — H52203 Unspecified astigmatism, bilateral: Secondary | ICD-10-CM | POA: Diagnosis not present

## 2021-12-19 DIAGNOSIS — E1169 Type 2 diabetes mellitus with other specified complication: Secondary | ICD-10-CM | POA: Diagnosis not present

## 2021-12-19 DIAGNOSIS — E559 Vitamin D deficiency, unspecified: Secondary | ICD-10-CM | POA: Diagnosis not present

## 2021-12-19 DIAGNOSIS — M7989 Other specified soft tissue disorders: Secondary | ICD-10-CM | POA: Diagnosis not present

## 2021-12-19 DIAGNOSIS — I10 Essential (primary) hypertension: Secondary | ICD-10-CM | POA: Diagnosis not present

## 2021-12-19 DIAGNOSIS — M179 Osteoarthritis of knee, unspecified: Secondary | ICD-10-CM | POA: Diagnosis not present

## 2021-12-19 DIAGNOSIS — Z0000 Encounter for general adult medical examination without abnormal findings: Secondary | ICD-10-CM | POA: Diagnosis not present

## 2021-12-19 DIAGNOSIS — Z86010 Personal history of colonic polyps: Secondary | ICD-10-CM | POA: Diagnosis not present

## 2021-12-19 DIAGNOSIS — E782 Mixed hyperlipidemia: Secondary | ICD-10-CM | POA: Diagnosis not present

## 2021-12-19 DIAGNOSIS — Z23 Encounter for immunization: Secondary | ICD-10-CM | POA: Diagnosis not present

## 2021-12-19 DIAGNOSIS — K219 Gastro-esophageal reflux disease without esophagitis: Secondary | ICD-10-CM | POA: Diagnosis not present

## 2022-02-16 DIAGNOSIS — M79645 Pain in left finger(s): Secondary | ICD-10-CM | POA: Diagnosis not present

## 2022-02-16 DIAGNOSIS — M19049 Primary osteoarthritis, unspecified hand: Secondary | ICD-10-CM | POA: Diagnosis not present

## 2022-05-11 ENCOUNTER — Ambulatory Visit (INDEPENDENT_AMBULATORY_CARE_PROVIDER_SITE_OTHER): Payer: Medicare Other | Admitting: Orthopaedic Surgery

## 2022-05-11 ENCOUNTER — Encounter: Payer: Self-pay | Admitting: Orthopaedic Surgery

## 2022-05-11 DIAGNOSIS — M65312 Trigger thumb, left thumb: Secondary | ICD-10-CM | POA: Diagnosis not present

## 2022-05-11 MED ORDER — LIDOCAINE HCL 1 % IJ SOLN
1.0000 mL | INTRAMUSCULAR | Status: AC | PRN
  Administered 2022-05-11: 1 mL

## 2022-05-11 MED ORDER — METHYLPREDNISOLONE ACETATE 40 MG/ML IJ SUSP
40.0000 mg | INTRAMUSCULAR | Status: AC | PRN
  Administered 2022-05-11: 40 mg

## 2022-05-11 NOTE — Progress Notes (Signed)
The patient is a 72 year old gentleman that I am seeing for the first time.  He comes in with pain around his left thumb and he points to the MCP joint but it sounds like he is describing triggering.  There is no pain around the [facility] joint where he points but he feels like he has to shake the thumb sometimes to get it moving again.  He is a diabetic.  He is an active individual.  He is right-hand dominant.  He denies any specific injuries and denies numbness and tingling.  Examination of his left hand shows pain over the A1 pulley and there is some active triggering.  The remainder of his hand exam is normal.  I did recommend a steroid injection over the A1 pulley.  His blood glucose has been running good.  He understands that this could elevate his blood glucose levels.  I provided a steroid injection without difficulty over the A1 pulley.  He tolerated it well.  I want him to try also Voltaren gel 2-3 times a day over this area.  If this does not resolve or does not get better he knows to come back and see [place].  Follow-up otherwise is as needed.       Procedure Note  Patient: Steven Kelly             Date of Birth: 03-24-1950           MRN: [id number]             Visit Date: 05/11/2022  Procedures: Visit Diagnoses:  1. Trigger thumb, left thumb     Hand/UE Inj: L thumb A1 for trigger finger on 05/11/2022 12:54 PM Medications: 1 mL lidocaine 1 %; 40 mg methylPREDNISolone acetate 40 MG/ML

## 2022-06-21 DIAGNOSIS — K219 Gastro-esophageal reflux disease without esophagitis: Secondary | ICD-10-CM | POA: Diagnosis not present

## 2022-06-21 DIAGNOSIS — R252 Cramp and spasm: Secondary | ICD-10-CM | POA: Diagnosis not present

## 2022-06-21 DIAGNOSIS — I10 Essential (primary) hypertension: Secondary | ICD-10-CM | POA: Diagnosis not present

## 2022-06-21 DIAGNOSIS — E1169 Type 2 diabetes mellitus with other specified complication: Secondary | ICD-10-CM | POA: Diagnosis not present

## 2022-06-21 DIAGNOSIS — E119 Type 2 diabetes mellitus without complications: Secondary | ICD-10-CM | POA: Diagnosis not present

## 2022-09-25 DIAGNOSIS — M542 Cervicalgia: Secondary | ICD-10-CM | POA: Diagnosis not present

## 2022-10-13 DIAGNOSIS — M542 Cervicalgia: Secondary | ICD-10-CM | POA: Diagnosis not present

## 2022-10-20 DIAGNOSIS — R350 Frequency of micturition: Secondary | ICD-10-CM | POA: Diagnosis not present

## 2022-11-08 DIAGNOSIS — E119 Type 2 diabetes mellitus without complications: Secondary | ICD-10-CM | POA: Diagnosis not present

## 2022-11-08 DIAGNOSIS — H52203 Unspecified astigmatism, bilateral: Secondary | ICD-10-CM | POA: Diagnosis not present

## 2022-11-08 DIAGNOSIS — H2513 Age-related nuclear cataract, bilateral: Secondary | ICD-10-CM | POA: Diagnosis not present

## 2022-11-21 DIAGNOSIS — Z23 Encounter for immunization: Secondary | ICD-10-CM | POA: Diagnosis not present

## 2023-01-04 DIAGNOSIS — K219 Gastro-esophageal reflux disease without esophagitis: Secondary | ICD-10-CM | POA: Diagnosis not present

## 2023-01-04 DIAGNOSIS — E119 Type 2 diabetes mellitus without complications: Secondary | ICD-10-CM | POA: Diagnosis not present

## 2023-01-04 DIAGNOSIS — E559 Vitamin D deficiency, unspecified: Secondary | ICD-10-CM | POA: Diagnosis not present

## 2023-01-04 DIAGNOSIS — Z0000 Encounter for general adult medical examination without abnormal findings: Secondary | ICD-10-CM | POA: Diagnosis not present

## 2023-01-04 DIAGNOSIS — Z23 Encounter for immunization: Secondary | ICD-10-CM | POA: Diagnosis not present

## 2023-01-04 DIAGNOSIS — E1169 Type 2 diabetes mellitus with other specified complication: Secondary | ICD-10-CM | POA: Diagnosis not present

## 2023-01-04 DIAGNOSIS — R252 Cramp and spasm: Secondary | ICD-10-CM | POA: Diagnosis not present

## 2023-01-04 DIAGNOSIS — E782 Mixed hyperlipidemia: Secondary | ICD-10-CM | POA: Diagnosis not present

## 2023-01-04 DIAGNOSIS — Z860100 Personal history of colon polyps, unspecified: Secondary | ICD-10-CM | POA: Diagnosis not present

## 2023-01-04 DIAGNOSIS — M542 Cervicalgia: Secondary | ICD-10-CM | POA: Diagnosis not present

## 2023-01-04 DIAGNOSIS — I10 Essential (primary) hypertension: Secondary | ICD-10-CM | POA: Diagnosis not present

## 2023-02-23 ENCOUNTER — Other Ambulatory Visit (INDEPENDENT_AMBULATORY_CARE_PROVIDER_SITE_OTHER): Payer: Medicare Other

## 2023-02-23 ENCOUNTER — Ambulatory Visit: Payer: Medicare Other | Admitting: Physician Assistant

## 2023-02-23 ENCOUNTER — Encounter: Payer: Self-pay | Admitting: Physician Assistant

## 2023-02-23 DIAGNOSIS — M25562 Pain in left knee: Secondary | ICD-10-CM | POA: Insufficient documentation

## 2023-02-23 MED ORDER — MELOXICAM 7.5 MG PO TABS: 7.5000 mg | ORAL_TABLET | Freq: Every day | ORAL | 0 refills | Status: AC

## 2023-02-23 NOTE — Progress Notes (Signed)
Office Visit Note   Patient: Steven Kelly           Date of Birth: 1951/01/19           MRN: [id number] Visit Date: 02/23/2023              Requested by: No referring provider defined for this encounter. PCP: Marden Noble, MD (Inactive)   Assessment & Plan: Visit Diagnoses:  1. Left knee pain, unspecified chronicity     Plan: Pleasant active 72 year old gentleman who enjoys working out in the gym.  Presents with a 2-week history of anterior knee pain.  Denies any instability or any injuries actually feels better when he is working out.  Though he has some early degenerative changes he seems to be focally most tender at the insertion of the distal patella tendon into the tibial tubercle.  Also has recreation of symptoms with activation of the patella tendon complex.  Not as tender in the joint line or the patellofemoral joint.  Will first have a trial of an anti-inflammatory such as meloxicam 7-1/2 mg daily.  He knows not to take ibuprofen while he is taking this and to take it with food.  He will take this for 3 weeks.  Also can use topical Voltaren gel in the area twice a day.  Also have provided him a patella strap to see if this gives him some relief will follow-up if he does not get better could consider an intra-articular injection at that time  Follow-Up Instructions: No follow-ups on file.   Orders:  No orders of the defined types were placed in this encounter.  No orders of the defined types were placed in this encounter.     Procedures: No procedures performed   Clinical Data: No additional findings.   Subjective: Chief Complaint  Patient presents with   Left Knee - Pain    HPI patient is a pleasant 72 year old gentleman who comes in today with a 2-week history of left knee pain.  Denies any injury.  States the entire knee hurts has some swelling gym helps.  He is taking ibuprofen.  Denies any fever or chills   Review of Systems  All other systems reviewed  and are negative.    Objective: Vital Signs: There were no vitals taken for this visit.  Physical Exam Constitutional:      Appearance: Normal appearance.  Pulmonary:     Effort: Pulmonary effort is normal.  Skin:    General: Skin is warm and dry.  Neurological:     General: No focal deficit present.     Mental Status: He is alert.  Psychiatric:        Mood and Affect: Mood normal.        Behavior: Behavior normal.    Ortho Exam Examination of his left knee no effusion no erythema compartments are soft and compressible he is neurovascular intact he is good good varus valgus stability no tenderness posteriorly.  He does have focal tenderness is at the distal insertion of the patella tendon into the tibial tubercle with resisted extension this seems to recreate some of this pain Specialty Comments:  No specialty comments available.  Imaging: No results found.   PMFS History: Patient Active Problem List   Diagnosis Date Noted   Pain in left knee 02/23/2023   Past Medical History:  Diagnosis Date   Diabetes mellitus without complication (HCC)    Hypercholesteremia    Hypertension     History reviewed.  No pertinent family history.  Past Surgical History:  Procedure Laterality Date   BACK SURGERY     Social History   Occupational History   Not on file  Tobacco Use   Smoking status: Never   Smokeless tobacco: Never  Substance and Sexual Activity   Alcohol use: No   Drug use: Not on file   Sexual activity: Not on file

## 2023-04-12 DIAGNOSIS — Z79899 Other long term (current) drug therapy: Secondary | ICD-10-CM | POA: Diagnosis not present

## 2023-07-04 DIAGNOSIS — E782 Mixed hyperlipidemia: Secondary | ICD-10-CM | POA: Diagnosis not present

## 2023-07-04 DIAGNOSIS — E1169 Type 2 diabetes mellitus with other specified complication: Secondary | ICD-10-CM | POA: Diagnosis not present

## 2023-07-04 DIAGNOSIS — E559 Vitamin D deficiency, unspecified: Secondary | ICD-10-CM | POA: Diagnosis not present

## 2023-07-04 DIAGNOSIS — I10 Essential (primary) hypertension: Secondary | ICD-10-CM | POA: Diagnosis not present

## 2023-07-04 DIAGNOSIS — Z860101 Personal history of adenomatous and serrated colon polyps: Secondary | ICD-10-CM | POA: Diagnosis not present

## 2023-09-03 DIAGNOSIS — I10 Essential (primary) hypertension: Secondary | ICD-10-CM | POA: Diagnosis not present

## 2023-09-03 DIAGNOSIS — E782 Mixed hyperlipidemia: Secondary | ICD-10-CM | POA: Diagnosis not present

## 2023-09-03 DIAGNOSIS — E1169 Type 2 diabetes mellitus with other specified complication: Secondary | ICD-10-CM | POA: Diagnosis not present

## 2023-10-04 DIAGNOSIS — E782 Mixed hyperlipidemia: Secondary | ICD-10-CM | POA: Diagnosis not present

## 2023-10-04 DIAGNOSIS — E1169 Type 2 diabetes mellitus with other specified complication: Secondary | ICD-10-CM | POA: Diagnosis not present

## 2023-10-04 DIAGNOSIS — I10 Essential (primary) hypertension: Secondary | ICD-10-CM | POA: Diagnosis not present

## 2023-10-15 DIAGNOSIS — E1165 Type 2 diabetes mellitus with hyperglycemia: Secondary | ICD-10-CM | POA: Diagnosis not present

## 2023-10-24 DIAGNOSIS — R350 Frequency of micturition: Secondary | ICD-10-CM | POA: Diagnosis not present

## 2023-11-04 DIAGNOSIS — E1169 Type 2 diabetes mellitus with other specified complication: Secondary | ICD-10-CM | POA: Diagnosis not present

## 2023-11-04 DIAGNOSIS — E782 Mixed hyperlipidemia: Secondary | ICD-10-CM | POA: Diagnosis not present

## 2023-11-04 DIAGNOSIS — I10 Essential (primary) hypertension: Secondary | ICD-10-CM | POA: Diagnosis not present

## 2023-11-09 DIAGNOSIS — E1169 Type 2 diabetes mellitus with other specified complication: Secondary | ICD-10-CM | POA: Diagnosis not present

## 2023-11-09 DIAGNOSIS — H52203 Unspecified astigmatism, bilateral: Secondary | ICD-10-CM | POA: Diagnosis not present

## 2023-11-09 DIAGNOSIS — H2513 Age-related nuclear cataract, bilateral: Secondary | ICD-10-CM | POA: Diagnosis not present

## 2023-12-04 DIAGNOSIS — I10 Essential (primary) hypertension: Secondary | ICD-10-CM | POA: Diagnosis not present

## 2023-12-04 DIAGNOSIS — E782 Mixed hyperlipidemia: Secondary | ICD-10-CM | POA: Diagnosis not present

## 2023-12-04 DIAGNOSIS — E1169 Type 2 diabetes mellitus with other specified complication: Secondary | ICD-10-CM | POA: Diagnosis not present

## 2023-12-08 DIAGNOSIS — Z23 Encounter for immunization: Secondary | ICD-10-CM | POA: Diagnosis not present

## 2023-12-19 NOTE — Progress Notes (Signed)
 Steven Kelly                                          MRN: [id number]   12/19/2023   The [facility] Quality Team Specialist reviewed this patient medical record for the purposes of chart review for care gap closure. The following were reviewed: chart review for care gap closure-kidney health evaluation for diabetes:eGFR .    [facility] Quality Team
